# Patient Record
Sex: Female | Born: 2000 | Race: Asian | Hispanic: No | Marital: Single
Health system: Southern US, Community
[De-identification: ages and names within clinical notes are randomized; demographics above are authoritative.]

---

## 2019-09-19 ENCOUNTER — Other Ambulatory Visit: Payer: Self-pay

## 2019-09-19 ENCOUNTER — Emergency Department (HOSPITAL_COMMUNITY)
Admission: EM | Admit: 2019-09-19 | Discharge: 2019-09-19 | Disposition: A | Discharge status: Home / Self Care | Payer: BC Managed Care – PPO | Attending: Emergency Medicine | Admitting: Emergency Medicine

## 2019-09-19 ENCOUNTER — Emergency Department (HOSPITAL_COMMUNITY): Payer: BC Managed Care – PPO

## 2019-09-19 DIAGNOSIS — S060X0A Concussion without loss of consciousness, initial encounter: Secondary | ICD-10-CM | POA: Diagnosis not present

## 2019-09-19 DIAGNOSIS — Y999 Unspecified external cause status: Secondary | ICD-10-CM | POA: Insufficient documentation

## 2019-09-19 DIAGNOSIS — Y92410 Unspecified street and highway as the place of occurrence of the external cause: Secondary | ICD-10-CM | POA: Insufficient documentation

## 2019-09-19 DIAGNOSIS — Y939 Activity, unspecified: Secondary | ICD-10-CM | POA: Insufficient documentation

## 2019-09-19 DIAGNOSIS — R519 Headache, unspecified: Secondary | ICD-10-CM | POA: Diagnosis present
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
## 2019-09-19 NOTE — ED Notes (Signed)
Patient verbalizes understanding of discharge instructions. Opportunity for questioning and answers were provided. Armband removed by staff, pt discharged from ED.  

## 2019-09-19 NOTE — ED Notes (Signed)
Pt walking to restroom.  

## 2019-09-19 NOTE — Discharge Instructions (Addendum)
You were seen in the emergency department today following a head injury.  We suspect that you have a concussion, otherwise known and as a mild traumatic brain injury.  Your  CT scan did not show any new abnormality such as a brain bleed.   1. Medications: Ibuprofen or Tylenol for pain 2. Treatment: Rest, ice on head.  Concussion precautions given - keep patient in a quiet, not simulating, dark environment. No TV, computer use, video games until headache is resolved completely. No contact sports until cleared by the primary care provider or pediatrician. 3. Follow Up: With primary care physician in 2-3 days if headache persists.  Return to the emergency department if patient becomes lethargic, begins vomiting , develops double vision, speech difficulty, problems walking or other change in mental status.  We would like you to follow-up with the [facility], contact information below:  Address: [address] [place], [place] [address] Phone: [phone]  Per [facility] Website:   What to Expect: Evaluations at the Concussion Clinic All patients at the Concussion Clinic are given an extensive three-part evaluation that includes: a computerized test to measure memory, visual processing speed, and reaction time a test that measures the systems that integrate movement, balance, and vision an in-depth review of a detailed symptoms checklist for signs of concussion The evaluation process is critical for the treatment and recovery of a concussed patient as no two concussions are alike. Thankfully, the diagnostic tools that trained professionals use can help to better manage head injuries.  Part of the technology the doctors and staff at [facility] use in their assessments is a computerized examination called ImPACT. This tool uses six tasks to measure memory, visual processing speed, and reaction time. By analyzing the results of the  examination and comparing them to average responses or a baseline score for a patient, our staff can make an informed judgment about the patient's cognitive functions. In addition to the ImPACT examination, patients are given a Vestibular Ocular Motor Screening test. This is a simple and painless test that focuses on the systems that integrate a patient's movement, balance, and vision.  These tests are used in conjunction with a thorough review of a detailed symptoms checklist to complete the patient's evaluation and develop a treatment plan.  You do not need a referral, and you can book an appointment online. Our Concussion Hotline is staffed by trained professionals during our regular office hours: Monday - Thursday from 7:30 AM to 4:30 PM, and Fridays from 7:30 AM to 12:00 PM, and the number to call is [phone]. The Concussion Clinic team meets with patients at our [address] office. Call today.   Further ED Instructions:   Please call and follow-up within the concussion clinic as well as your primary care provider within the next 3 to 5 days.  In the meantime we would like you to avoid strenuous/over exertional activities such as sports or running.  Please avoid excess screen time utilizing cell phones, computers, or the TV.  Please avoid activities that require significant amount of concentration.  Please try to rest as much as possible.  Please take Tylenol and/or Motrin per over-the-counter dosing instructions for any continued discomfort.  Return to the ER for new or worsening symptoms or any other concerns that you may have.  
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
## 2020-06-29 IMAGING — CT CT HEAD W/O CM
4 series · 16 of 47 positions shown, 18 images · non-contrast
Comparison: None.

CLINICAL DATA: Pain after trauma

EXAM:
CT HEAD WITHOUT CONTRAST
CT CERVICAL SPINE WITHOUT CONTRAST
TECHNIQUE: Multidetector CT imaging of the head and cervical spine was
performed following the standard protocol without intravenous
contrast. Multiplanar CT image reconstructions of the cervical spine
were also generated.

[Series 3: head without · axial · non-contrast · 0.39mm/px · z∈[-58,+62]mm · 7 of 32 slices shown, 9 images]
[im 4/32  brain]
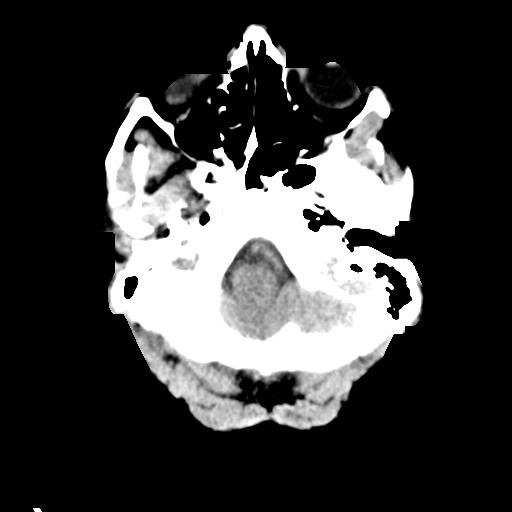
[im 4/32  bone]
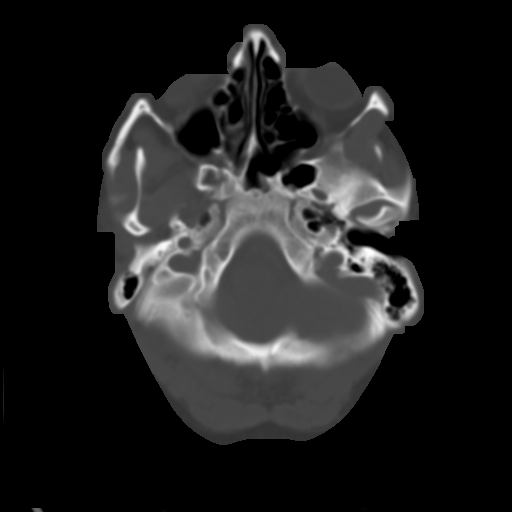
[im 8/32  brain]
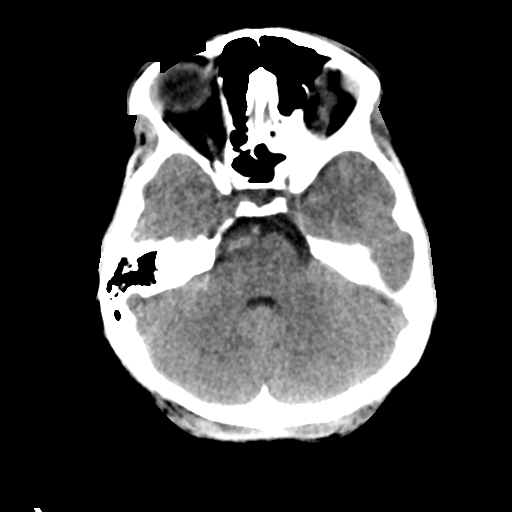
[im 12/32  brain]
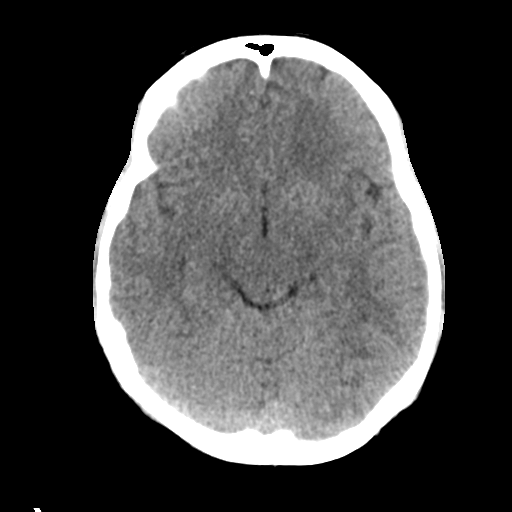
[im 16/32  brain]
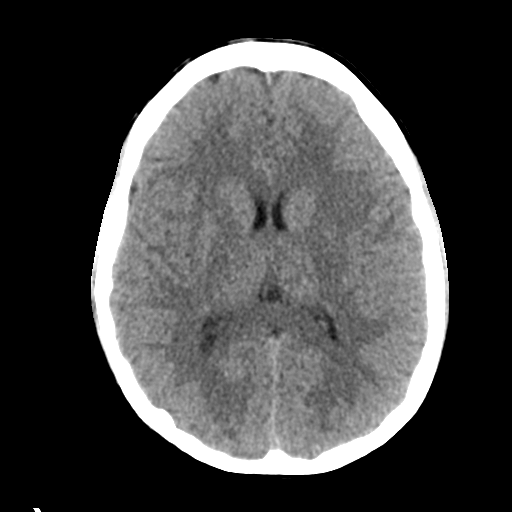
[im 20/32  brain]
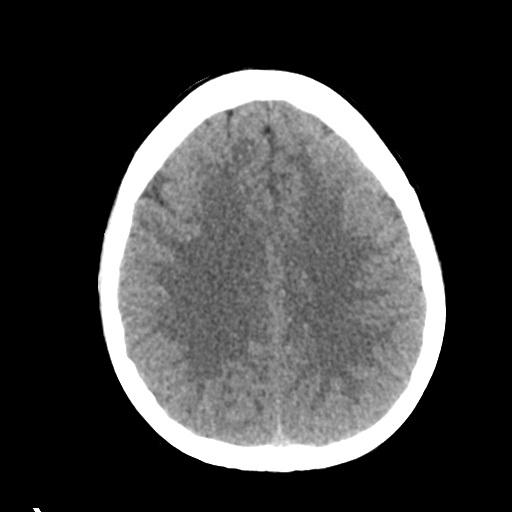
[im 20/32  bone]
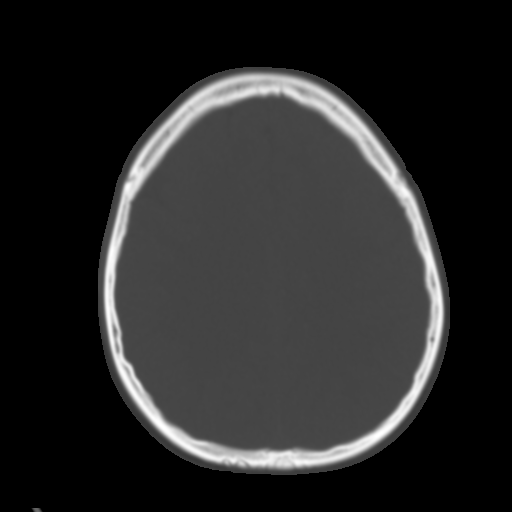
[im 24/32  brain]
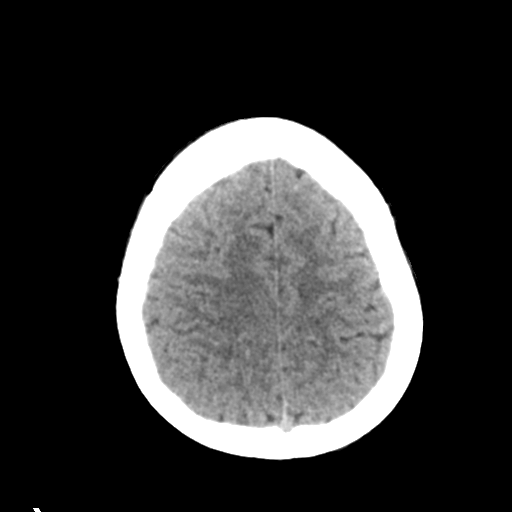
[im 28/32  brain]
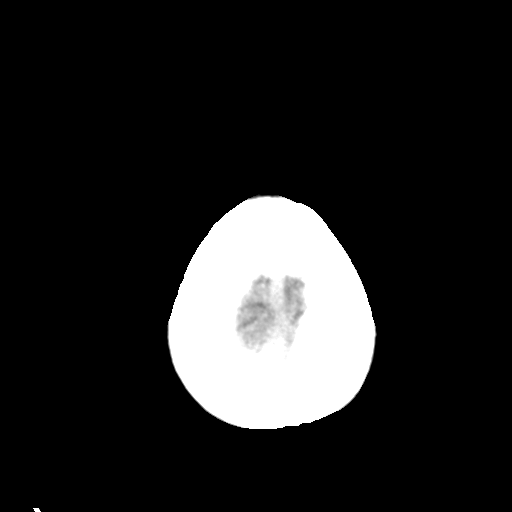

[Series 4: head bone · axial · 0.39mm/px · z∈[-59,-27]mm · 3 of 80 slices shown]
[im 8/80  bone]
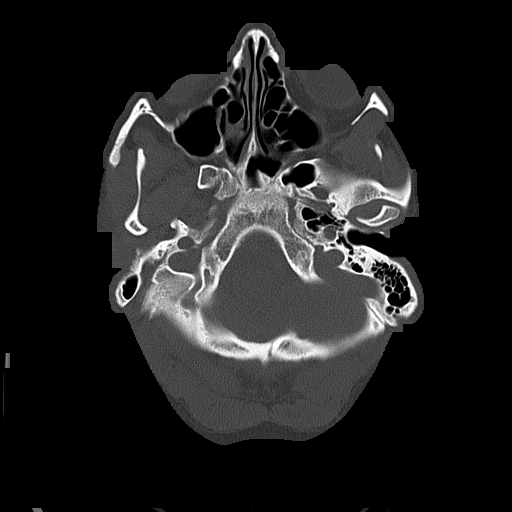
[im 16/80  bone]
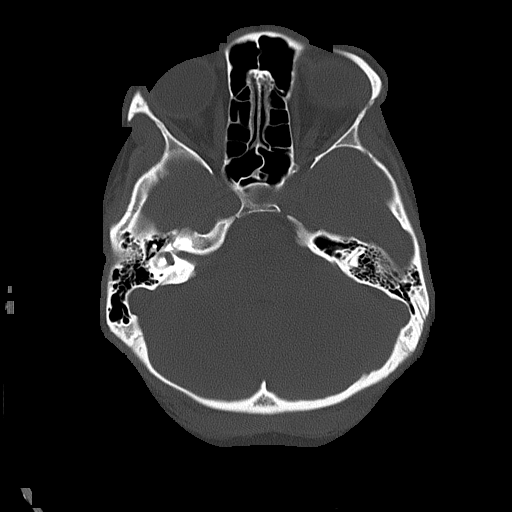
[im 24/80  bone]
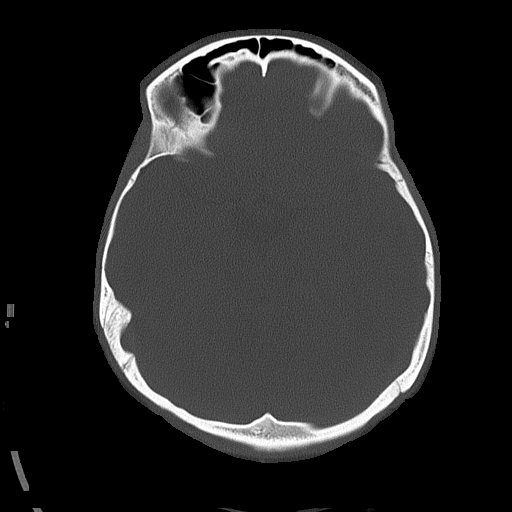

[Series 5: head without cor · coronal · non-contrast · 0.29mm/px · 3 of 64 slices shown]
[im 22/64  brain]
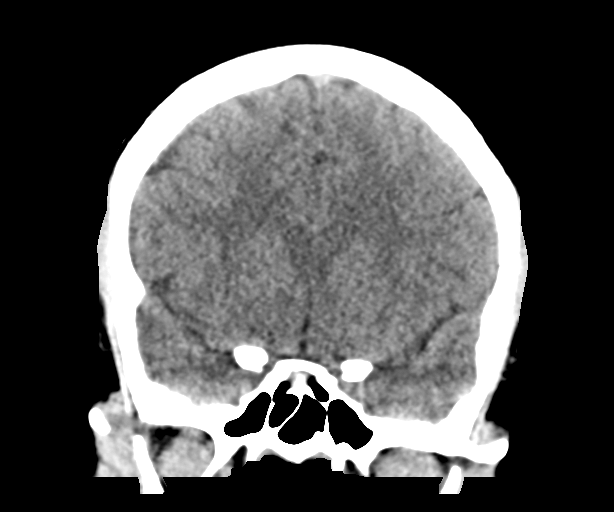
[im 29/64  brain]
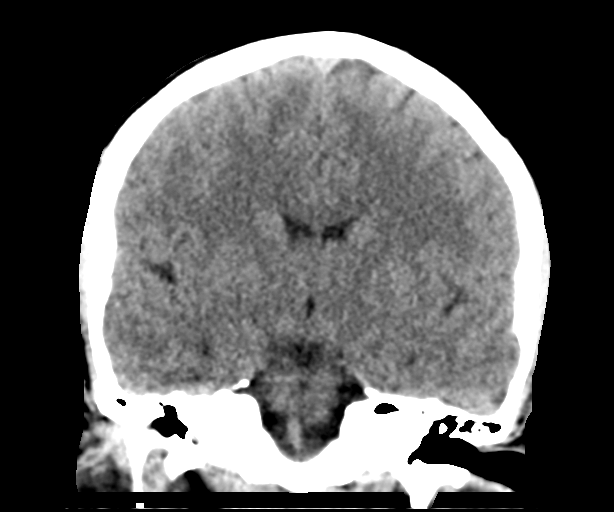
[im 36/64  brain]
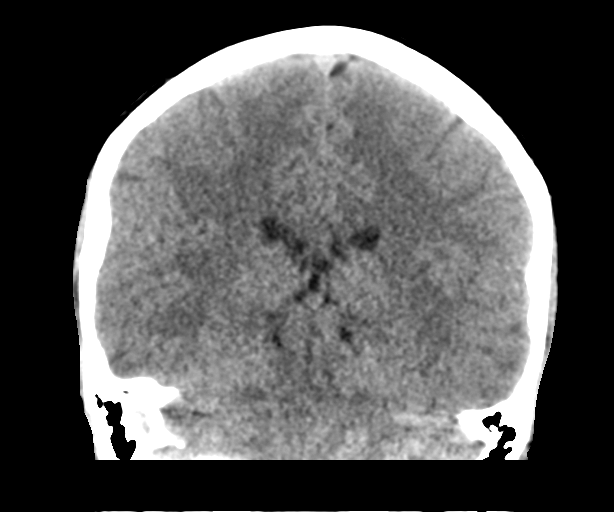

[Series 6: head without sag · sagittal · non-contrast · 0.31mm/px · 3 of 55 slices shown]
[im 19/55  brain]
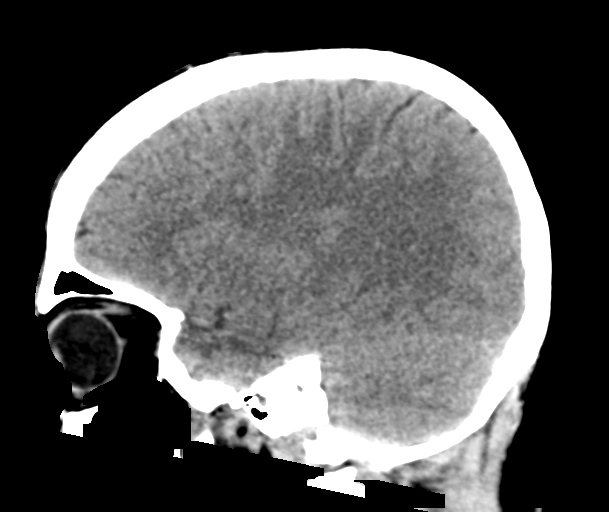
[im 28/55  brain]
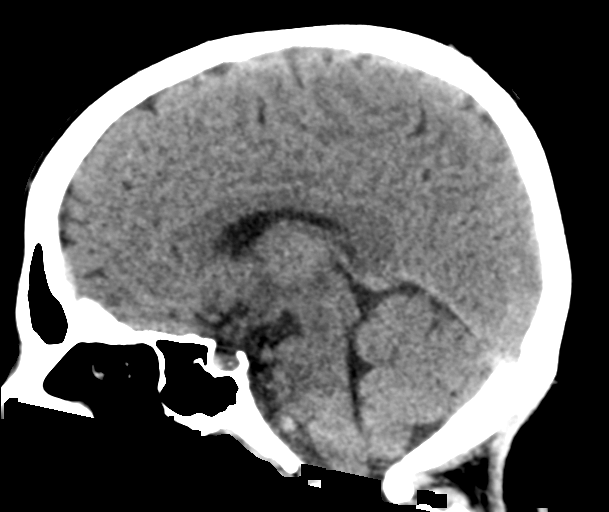
[im 37/55  brain]
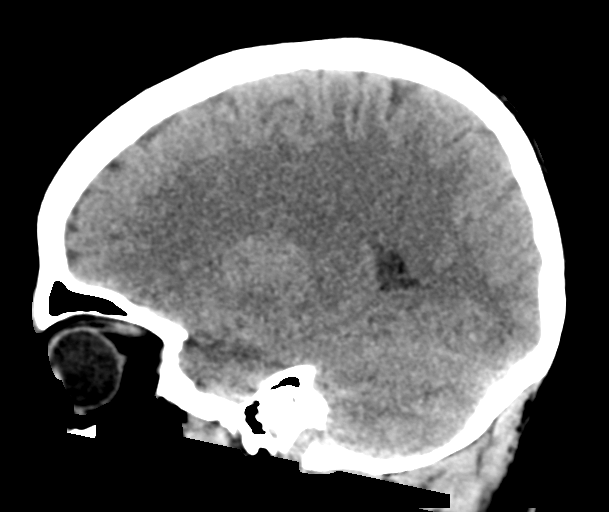

[16 of 47 positions shown; findings below may reference images not displayed]

FINDINGS: CT HEAD FINDINGS

Brain: No subdural, epidural, or subarachnoid hemorrhage. Mild high
attenuation in the right basal ganglia versus the left on axial
image 14 and sagittal image 24. Cerebellum, brainstem, and basal
cisterns are normal. Ventricles and sulci are unremarkable. No mass
effect or midline shift. No acute cortical ischemia or infarct.

Vascular: No hyperdense vessel or unexpected calcification.

Skull: Normal. Negative for fracture or focal lesion.

Sinuses/Orbits: No acute finding.

Other: None.

CT CERVICAL SPINE FINDINGS

Alignment: Normal.

Skull base and vertebrae: No acute fracture. No primary bone lesion
or focal pathologic process.

Soft tissues and spinal canal: No prevertebral fluid or swelling. No
visible canal hematoma.

Disc levels:  No significant degenerative changes.

Upper chest: Negative.

Other: No other abnormalities.
IMPRESSION: 1. No acute intracranial abnormality. Minimal increased attenuation
in the right basal ganglia versus the left is consistent with early
calcification of no acute significance.
2. No fracture or traumatic malalignment in the cervical spine.
